# Patient Record
Sex: Male | Born: 1965 | Race: White | Hispanic: No | Marital: Single | Smoking: Never smoker
Health system: Southern US, Community
[De-identification: ages and names within clinical notes are randomized; demographics above are authoritative.]

## PROBLEM LIST (undated history)

## (undated) DIAGNOSIS — N200 Calculus of kidney: Secondary | ICD-10-CM

## (undated) DIAGNOSIS — I10 Essential (primary) hypertension: Secondary | ICD-10-CM

## (undated) DIAGNOSIS — E119 Type 2 diabetes mellitus without complications: Secondary | ICD-10-CM

---

## 2011-02-26 ENCOUNTER — Inpatient Hospital Stay (HOSPITAL_COMMUNITY)
Admission: RE | Admit: 2011-02-26 | Discharge: 2011-03-03 | DRG: 350 | Disposition: A | Discharge status: Home / Self Care | Payer: BC Managed Care – PPO | Source: Ambulatory Visit | Attending: Urology | Admitting: Urology

## 2011-02-26 DIAGNOSIS — N498 Inflammatory disorders of other specified male genital organs: Principal | ICD-10-CM | POA: Diagnosis present

## 2011-02-26 DIAGNOSIS — A4902 Methicillin resistant Staphylococcus aureus infection, unspecified site: Secondary | ICD-10-CM | POA: Diagnosis present

## 2011-02-26 DIAGNOSIS — E669 Obesity, unspecified: Secondary | ICD-10-CM | POA: Diagnosis present

## 2011-02-26 LAB — BASIC METABOLIC PANEL
BUN: 13 mg/dL (ref 6–23)
Chloride: 102 mEq/L (ref 96–112)
GFR calc Af Amer: >90 mL/min (ref >90)
GFR calc non Af Amer: 79 mL/min — ABNORMAL LOW (ref >90)
Glucose, Bld: 134 mg/dL — ABNORMAL HIGH (ref 70–99)
Potassium: 4.4 mEq/L (ref 3.5–5.1)
Sodium: 137 mEq/L (ref 135–145)

## 2011-02-26 LAB — SURGICAL PCR SCREEN: MRSA, PCR: NEGATIVE

## 2011-02-27 DIAGNOSIS — N498 Inflammatory disorders of other specified male genital organs: Secondary | ICD-10-CM

## 2011-02-27 LAB — BASIC METABOLIC PANEL
BUN: 14 mg/dL (ref 6–23)
CO2: 26 mEq/L (ref 19–32)
Chloride: 103 mEq/L (ref 96–112)
Creatinine, Ser: 1.09 mg/dL (ref 0.50–1.35)
GFR calc Af Amer: >90 mL/min (ref >90)
Glucose, Bld: 197 mg/dL — ABNORMAL HIGH (ref 70–99)
Potassium: 4.5 mEq/L (ref 3.5–5.1)

## 2011-02-27 LAB — DIFFERENTIAL
Basophils Absolute: 0 10*3/uL (ref 0.0–0.1)
Eosinophils Relative: 0 % (ref 0–5)
Lymphocytes Relative: 7 % — ABNORMAL LOW (ref 12–46)
Lymphs Abs: 0.8 10*3/uL (ref 0.7–4.0)
Neutro Abs: 10.2 10*3/uL — ABNORMAL HIGH (ref 1.7–7.7)

## 2011-02-27 LAB — CBC
HCT: 38.5 % — ABNORMAL LOW (ref 39.0–52.0)
Hemoglobin: 12.9 g/dL — ABNORMAL LOW (ref 13.0–17.0)
MCV: 90.4 fL (ref 78.0–100.0)
RBC: 4.26 MIL/uL (ref 4.22–5.81)
WBC: 11.4 10*3/uL — ABNORMAL HIGH (ref 4.0–10.5)

## 2011-02-28 LAB — DIFFERENTIAL
Basophils Absolute: 0 10*3/uL (ref 0.0–0.1)
Basophils Relative: 0 % (ref 0–1)
Eosinophils Absolute: 0.1 10*3/uL (ref 0.0–0.7)
Eosinophils Relative: 1 % (ref 0–5)
Lymphocytes Relative: 24 % (ref 12–46)
Lymphs Abs: 2.3 10*3/uL (ref 0.7–4.0)
Monocytes Absolute: 0.6 10*3/uL (ref 0.1–1.0)
Monocytes Relative: 7 % (ref 3–12)
Neutro Abs: 6.5 10*3/uL (ref 1.7–7.7)
Neutrophils Relative %: 68 % (ref 43–77)

## 2011-02-28 LAB — CBC
HCT: 35 % — ABNORMAL LOW (ref 39.0–52.0)
Hemoglobin: 11.4 g/dL — ABNORMAL LOW (ref 13.0–17.0)
MCH: 29.9 pg (ref 26.0–34.0)
MCHC: 32.6 g/dL (ref 30.0–36.0)
MCV: 91.9 fL (ref 78.0–100.0)
Platelets: 290 10*3/uL (ref 150–400)
RBC: 3.81 MIL/uL — ABNORMAL LOW (ref 4.22–5.81)
RDW: 12.1 % (ref 11.5–15.5)
WBC: 9.6 10*3/uL (ref 4.0–10.5)

## 2011-02-28 NOTE — Consult Note (Signed)
[name] Barrera, [name] Barrera NO.:  [id number]  MEDICAL RECORD NO.:  [id number]  LOCATION:  1401                         FACILITY:  [facility]  PHYSICIAN:  Judyann Munson, MD     DATE OF BIRTH:  06/08/1965  DATE OF CONSULTATION:  02/27/2011 DATE OF DISCHARGE:                                CONSULTATION   REQUESTING PHYSICIAN:  Jerilee Field, MD  REASON FOR CONSULTATION:  MRSA scrotal abscess.  HISTORY OF PRESENT ILLNESS:  Mr. Meek is a pleasant 45 year old male with no previous medical problems, who was noted to have 4 days prior to admission, early Sunday morning,to have a small knot, a subcentimeter in diameter, at the base of his scrotum.  He initially thought it was an  infected pimple.   By that evening, he started noticing that it had increased in size, at least 1 cm in diameter and exquisitely tender to palpation.  He also started to have low-grade fevers. subsequently. overnight had rigors and high temperatures of 102 for which he took Tylenol.  He went to work the following day but  by the day's end, started noticing having significant discomfort while sitting as well as swelling of his scrotum.  He went to his primary care provider the next day which was 2 days prior to this hospitalization where it was noted that he had cellulitis and possible fluctuance at the base of his scrotum.  His primary care provider gave him a  dose of ceftriaxone as well as pain medication and arranged for the patient to be seen by Urology that day.  At the urologist's office, he underwent simple I and D at the office for which an aerobic culture was sent of the purulent drainage.  That specimen had identified MRSA.  It is important to note that the patient already had received a dose of ceftriaxone.  It was noted that he was quite erythematous in the region and the urologist had arranged for the patient to come back within 48 hours for further evaluation.  He was given a  prescription for cephalexin as well as doxycycline.  During this time period, the patient states that he did not have that much significant improvement and still was increasingly tender at the base of his scrotum.  He still was having high fevers with rigors and felt that the swelling was extending by his pubic hairline.  When he was evaluated 48hrs after initial I X D, his urologist was able to express purulent material from  initial lancing site. Due to the extension of cellulitis, he admitted  was admitted to [facility] on October 11th for I and D under general anesthesia.  The patient underwent incision and drainage of his scrotal abscess and a bilateral spermatic cord block on October 11.  Very little blood loss, however, there was a large cavity extending 12 cm into the scrotum due to the abscess.  Specimen was sent for culture.  In the meantime, the patient has been placed on vancomycin, piperacillin and tazobactam, and gentamicin.  Since then, the patient has been afebrile. On admit,  he did have leukocytosis of 11.4 with a left shift of 89%.  The patient states that he is no longer febrile, however, he is in quite a bit of discomfort when he has to have his dressing changes.  The patient is not known to have any soft skin tissue infection.  No boils.  The only mentioned is that his elder son does have MRSA skin infections.  The patient last went to work on Monday, 3 days prior to admission, due to the discomfort he was having.  PAST MEDICAL HISTORY: 1. Acid reflux. 2. Occasional palpitations. 3. History of a vasectomy and a vasectomy reversal.  ALLERGIES:  No known drug allergies.  MEDICATIONS: 1. Vancomycin 1 mg q.12. 2. Piperacillin and tazobactam 3.75 mg IV q.8. 3. Gentamicin 560 mg IV q.24. 4. Enoxaparin 40 mg subcu daily. 5. Morphine 2 mg IV p.r.n. 6. Tylenol 650 mg p.o. q.4. 7. Phenergan 25 mg p.o. q.4 p.r.n. 8. Ambien 5 mg q.h.s. p.r.n.  REVIEW OF SYSTEMS:   Only thing what is mentioned in the HPI was fevers, chills, night sweats, scrotal swelling, and erythema.  He also noted to have some malaise, decreased appetite, but no other symptoms.  No headache, neck pain, difficulty vision.  No dysphagia.  No nausea or vomiting.  No diarrhea or constipation.  No arthralgias and no other rash other than on his scrotum.  A 12-point review of systems has been reviewed and otherwise negative.  SOCIAL HISTORY:  The patient works full-time in a Environmental education officer.  He also is divorced.  His wife is a Engineer, civil (consulting), but they are still in close contact.  They have 3 sons.  He enjoys fishing and hunting with his sons.  No smoking.  Occasional alcohol use.  No illicit drug use.  FAMILY HISTORY:  Concerning for nephrolithiasis and mother had breast cancer.  PHYSICAL EXAMINATION:  VITAL SIGNS:  He is afebrile at 98.1, T-max of 99.0; pulse is 71; blood pressure 115/68; respiration rate 16 to 20; 98% on room air. GENERAL:  This is a pleasant 45 year old Caucasian male in no acute distress. HEENT:  Normocephalic, atraumatic.  PERRLA, EOMI.  No scleral icterus. Oropharynx is clear.  No signs of thrush. NECK:  Supple.  No lymphadenopathy.  No JVD. PULMONARY EXAM:  Clear to auscultation bilaterally.  No wheezes, crackles, or rhonchi. CARDIAC EXAM:  Normal S1 and S2.  No gallops, murmurs, or rubs. ABDOMEN:  Protuberant abdomen.  Nontender, nondistended.  Positive bowel sounds.  No hepatosplenomegaly, trace inguinal lymphadenopathy. GU:  The patient has some erythema and swelling to his pubic symphysis. It has been outlined for the line of demarcation and still has packing from his scrotal incision and drainage.  No erythema extending to his thighs, but definitely to his inguinal line. EXTREMITIES;  No clubbing, cyanosis, or edema. SKIN EXAM:  No signs of rash other than in the scrotal area with erythema.  No other ulcers. NEUROLOGICAL EXAM:  Alert and oriented  x3.  Cranial nerves II-XII are grossly intact.  Motor is 5/5 in upper and lower.  Strength and sensation intact.  LABORATORY DATA:  His white count is 11.4, 89% neutrophils, hemoglobin 12.9, hematocrit 35.5, platelets are 279.  Creatinine of 1.09.  Micro data from cultures from the outside lab on October 9 shows few wbc's, GPCs in pairs and clusters identified as MRSA, sensitive to Bactrim of less than 10, gentamicin less than 0.5, vancomycin 1, clindamycin of less than 0.2, resistant to erythromycin, resistant to Cipro.  Rifampin is less than 0.5, tetracycline less than 1, linezolid of  2.  Microbiology:  Cultures from debridement on October 11, showed rare wbc, predominant PMNs.  No organism isolated thus far.  ASSESSMENT AND PLAN:  This is a 45 year old male, previously in good health, with scrotal abscess with methicillin-resistant Staphylococcus aureus; status post incision and drainage on October 11, postoperative day 1; remains afebrile on broad-spectrum antibiotics; vancomycin, piperacillin and tazobactam, and gentamicin.  Given the area of his abscess, potentially it could be polymicrobial.    I would recommend continuing on vancomycin and piperacillin/tazobactam until his cultures are finalized. due to the spread of interaction and the quick onset, it still would favor just being an methicillin-resistant Staphylococcus aureus.    Would recommend that his vancomycin trough (goal 10 to 15) We would discontinue the gentamicin as he appears stable and is not needed at this time.    We will follow up ulture and decide what all regimen could be given for the patient, be it Bactrim or doxycycline and would treat for a period of 14 days.  We will decide if the patient needs gram-negative coverage based on culture data which takes roughly 48 hours to finalize.  It has been a pleasure to see Mr. Rademaker.  Greater than half of this 45- minute assessment was spent counseling the  patient and review records.          ______________________________ Judyann Munson, MD     CS/MEDQ  D:  02/27/2011  T:  02/27/2011  Job:  [id number]  Electronically Signed by Judyann Munson MD on 02/28/2011 10:36:53 AM

## 2011-03-01 LAB — CULTURE, ROUTINE-ABSCESS

## 2011-03-02 LAB — VANCOMYCIN, TROUGH: Vancomycin Tr: 8.5 ug/mL — ABNORMAL LOW (ref 10.0–20.0)

## 2011-03-02 LAB — CREATININE, SERUM
Creatinine, Ser: 0.97 mg/dL (ref 0.50–1.35)
GFR calc non Af Amer: >90 mL/min (ref >90)

## 2011-03-03 LAB — ANAEROBIC CULTURE

## 2011-03-04 NOTE — Discharge Summary (Signed)
  [name], [name] Barrera               ACCOUNT NO.:  [id number]  MEDICAL RECORD NO.:  [id number]  LOCATION:  1401                         FACILITY:  [facility]  PHYSICIAN:  Jerilee Field, MD   DATE OF BIRTH:  [birth year]  DATE OF ADMISSION:  02/26/2011 DATE OF DISCHARGE:  03/03/2011                              DISCHARGE SUMMARY   ADMITTING DIAGNOSES:  Scrotal abscess and cellulitis.  DISCHARGE DIAGNOSES:  Scrotal abscess and cellulitis - methicillin- resistant Staphylococcus aureus.  PROCEDURE DURING HOSPITALIZATION:  Scrotal incision and drainage.  HOSPITAL COURSE:  Mr. Cremer is a 45 year old male.  He is admitted to the hospital following incision of a left posterior scrotal abscess. The patient initially failed outpatient I and D with doxycycline and Keflex coverage.  Cultures grew out abundant methicillin-resistant Staph aureus sensitive to vancomycin.  The patient was initially admitted for IV antibiotics and wound care.  ID was consulted.  His gentamicin was discontinued initially followed by the Zosyn, and the patient remained on vancomycin therapy for MRSA.  After the drainage, he developed erythema of the suprapubic region, which took several days to resolve, but has faded significantly today on day of discharge.  The patient has remained afebrile with stable vitals.  He was also kept for IV Ativan and allotted for dressing changes.  The patient has been getting a wet- to-dry dressing change each day.  Today on the day of discharge, the patient has much improved pain there.  The wound is clean with no drainage.  There is just a slight remaining bed of induration at the superior portion of the wound without erythema or fluctuance.  There is also no edema or crepitus.  The erythema has greatly diminished. Infectious disease saw the patient and recommended Bactrim DS 2 tablets p.o. b.i.d. for another 7 days.  The patient and his wife have been performing the dressing changes.   She is a Engineer, civil (consulting) with good knowledge of the wet-to-dry technique.  DISCHARGE MEDICATIONS: 1. Bactrim DS 2 tablets p.o. b.i.d. for 7 days. 2. Percocet 5/325 one to two p.o. q.6 hours p.r.n. pain. 3. Ativan 1 mg 1 to 2 p.o. p.r.n. dressing changes.  These are new     prescriptions and the following are continuation of his home     medicines: 4. Tylenol Extra Strength 500 mg. 5. Multivitamin.  The following prescriptions were stopped on admission: 1. Keflex 500 mg. 2. Doxycycline 100 mg.  DISCHARGE INSTRUCTIONS:  The patient may shower.  He was instructed to continue daily wet-to-dry dressing changes of the left scrotal wound. He was instructed to call if he has any fever, chills; if he feels poorly; notices any erythema, swelling, pain, or drainage of his wound; or any other concerns.  FOLLOWUP APPOINTMENT:  The patient to follow up with Dr. Mena Goes at [facility] in 48 hours on March 05, 2011.  The patient will call [phone] to confirm appointment.          ______________________________ Jerilee Field, MD     ME/MEDQ  D:  03/03/2011  T:  03/03/2011  Job:  [id number]  Electronically Signed by Jerilee Field MD on 03/04/2011 01:07:47 PM

## 2011-03-06 NOTE — Op Note (Signed)
[name], Barrera NO.:  [id number]  MEDICAL RECORD NO.:  Barrera  LOCATION:  1401                         FACILITY:  [facility]  PHYSICIAN:  Natalia Leatherwood, MD    DATE OF BIRTH:  02/13/1966  DATE OF PROCEDURE:  02/26/2011 DATE OF DISCHARGE:                              OPERATIVE REPORT   SURGEON:  Natalia Leatherwood, MD.  ASSISTANT:  None.  PREOPERATIVE DIAGNOSIS:  Scrotal abscess.  POSTOPERATIVE DIAGNOSIS:  Scrotal abscess.  PROCEDURE PERFORMED:  Incision and drainage of scrotal abscess and bilateral spermatic cord block.  ESTIMATED BLOOD LOSS:  5 mL of blood.  SPECIMEN:  Swabs of the abscess sent for aerobic and anaerobic cultures to microbiology.  FINDINGS:  A large cavity and scrotum extending 12 cm into the scrotum due to the abscess.  HISTORY OF PRESENT ILLNESS:  A 45 year old gentleman, who underwent an incision and drainage of his scrotum on October 9, in the office with Dr. Jerilee Field.  The patient returned for followup today and was found to have more extensive abscess.  It was felt that this needed to go to the operating room.  I performed a history and physical examination on this patient.  We discussed the risks and benefits of the procedure as well as alternatives, which would include IV antibiotics and observation.  We discussed the likelihood that this procedure would achieve his goals.  The patient wished to proceed with incision and drainage.  Informed consent was obtained.  PROCEDURE:  After informed consent was obtained, the patient was taken to operating room, where he was placed in supine position.  IV antibiotics of vancomycin were infused and the general anesthesia was induced.  He was placed in a dorsal lithotomy position to make sure to pad all pertinent neurovascular pressure points.  After this, he was prepped and draped in a sterile fashion after his hair was removed from his scrotum.  A time-out was performed in  which the correct patient, surgical site, and procedure were agreed upon by the team.  After this, an incision with #10 blade was made over previous incision site with return of a large amount of purulent material.  This was swabbed and the swabs were sent for aerobic and anaerobic cultures.  Following this, it was felt the incision needed to be extended somewhat due to the depth of the abscess cavity.  This was extended slightly with the scalpel.  After this was done, Kelly clamps and digital examination were used to break up the loculations.  In the end, the abscess cavity did extend approximately 12 cm up into the scrotum towards the left inguinal region.  It was well contained in the cavity.  There was no involvement of the testicle.  After this was done, and all loculations were broken up.  One 1 liter normal saline was used to irrigate out the entire abscess cavity, and then 0.5 liter of double antibiotic irrigation was used to irrigate.  After this was done, the abscess cavity was packed with moist to dry Kerlix that was dampened with double antibiotic irrigation.  Hemostasis maintained with Bovie electrocautery.  After this was done, a bilateral spermatic cord block was  done with approximately 10 cc of 0.25% plain Marcaine bilaterally and then the edges of the incision were injected with Marcaine.  Total of 30 cc of Marcaine were used.  After this was complete, ABD pad and Kerlix were placed over the wound site and packing and then mesh underwear was placed on the patient.  He was placed back in a supine position. Anesthesia was reversed and was taken back to the PACU in stable condition.  The area of erythema was marked with a marker for future observation.  The patient will be admitted to the hospital for IV antibiotics and for observation.          ______________________________ Natalia Leatherwood, MD     DW/MEDQ  D:  02/26/2011  T:  02/27/2011  Job:   [id number]  Electronically Signed by Natalia Leatherwood MD on 03/06/2011 07:53:25 AM

## 2012-12-12 ENCOUNTER — Encounter (HOSPITAL_COMMUNITY): Payer: Self-pay | Admitting: *Deleted

## 2012-12-12 ENCOUNTER — Emergency Department (HOSPITAL_COMMUNITY): Payer: BC Managed Care – PPO

## 2012-12-12 ENCOUNTER — Emergency Department (HOSPITAL_COMMUNITY)
Admission: EM | Admit: 2012-12-12 | Discharge: 2012-12-13 | Disposition: A | Discharge status: Home / Self Care | Payer: BC Managed Care – PPO | Attending: Emergency Medicine | Admitting: Emergency Medicine

## 2012-12-12 DIAGNOSIS — N200 Calculus of kidney: Secondary | ICD-10-CM | POA: Insufficient documentation

## 2012-12-12 LAB — URINALYSIS, ROUTINE W REFLEX MICROSCOPIC
Ketones, ur: 15 mg/dL — AB
Protein, ur: NEGATIVE mg/dL
Specific Gravity, Urine: 1.036 — ABNORMAL HIGH (ref 1.005–1.030)
Urobilinogen, UA: 1 mg/dL (ref 0.0–1.0)

## 2012-12-12 LAB — CBC WITH DIFFERENTIAL/PLATELET
Basophils Absolute: 0 10*3/uL (ref 0.0–0.1)
HCT: 42.4 % (ref 39.0–52.0)
Lymphocytes Relative: 14 % (ref 12–46)
Neutro Abs: 7.8 10*3/uL — ABNORMAL HIGH (ref 1.7–7.7)
Platelets: 294 10*3/uL (ref 150–400)
RBC: 4.97 MIL/uL (ref 4.22–5.81)
RDW: 12.2 % (ref 11.5–15.5)
WBC: 9.6 10*3/uL (ref 4.0–10.5)

## 2012-12-12 LAB — COMPREHENSIVE METABOLIC PANEL
ALT: 54 U/L — ABNORMAL HIGH (ref 0–53)
AST: 33 U/L (ref 0–37)
Alkaline Phosphatase: 54 U/L (ref 39–117)
CO2: 25 mEq/L (ref 19–32)
Chloride: 105 mEq/L (ref 96–112)
GFR calc non Af Amer: 60 mL/min — ABNORMAL LOW (ref >90)
Sodium: 141 mEq/L (ref 135–145)
Total Bilirubin: 0.3 mg/dL (ref 0.3–1.2)

## 2012-12-12 LAB — URINE MICROSCOPIC-ADD ON

## 2012-12-12 MED ORDER — SODIUM CHLORIDE 0.9 % IV BOLUS (SEPSIS)
1000.0000 mL | Freq: Once | INTRAVENOUS | Status: AC
  Administered 2012-12-12: 1000 mL via INTRAVENOUS

## 2012-12-12 MED ORDER — MORPHINE SULFATE 4 MG/ML IJ SOLN
4.0000 mg | Freq: Once | INTRAMUSCULAR | Status: AC
  Administered 2012-12-12: 4 mg via INTRAVENOUS
  Filled 2012-12-12: qty 1

## 2012-12-12 MED ORDER — HYDROMORPHONE HCL PF 1 MG/ML IJ SOLN: 1.0000 mg | Freq: Once | INTRAMUSCULAR | Status: DC

## 2012-12-12 MED ORDER — ONDANSETRON HCL 4 MG/2ML IJ SOLN
4.0000 mg | Freq: Once | INTRAMUSCULAR | Status: AC
  Administered 2012-12-12: 4 mg via INTRAVENOUS
  Filled 2012-12-12: qty 2

## 2012-12-12 NOTE — ED Notes (Signed)
Pt in c/o right ground area into right hip area with vomiting, states he went to [facility] walk in [facility] and was sent here for further evaluation for r/o incarcerated hernia

## 2012-12-12 NOTE — ED Provider Notes (Signed)
CSN: [id number]     Arrival date & time 12/12/12  2101 History     First MD Initiated Contact with Patient 12/12/12 2237     Chief Complaint  Patient presents with  . Groin Pain    (Consider location/radiation/quality/duration/timing/severity/associated sxs/prior Treatment) HPI Comments: Pt w/ no PMHx now w/ right groin and flank pain. States acute onset at 5pm, severe right groin pain radiating to right flank. A/w multiple episodes n/v - NBNB. Denies hematuria, polyuria or dysuria. + hx of nephrolithiasis. No fever. No abd pain. No hx of hernia or abd surgery. Pain is constant and severe, aching. Exacerbated w/ movement and not relieved by anything. Seen at urgent care and concern for hernia and transferred to ED. On arrival pain is improving.   Patient is a 47 y.o. male presenting with general illness. The history is provided by the patient. No language interpreter was used.  Illness Location:  GI/GU Quality:  Right flank pain, emesis, groin pain Severity:  Severe Onset quality:  Sudden Timing:  Constant Progression:  Unchanged Chronicity:  New Associated symptoms: nausea and vomiting   Associated symptoms: no abdominal pain, no chest pain, no congestion, no cough, no diarrhea, no fever, no headaches, no rash, no shortness of breath and no sore throat     History reviewed. No pertinent past medical history. History reviewed. No pertinent past surgical history. History reviewed. No pertinent family history. History  Substance Use Topics  . Smoking status: Not on file  . Smokeless tobacco: Not on file  . Alcohol Use: Not on file    Review of Systems  Constitutional: Negative for fever and chills.  HENT: Negative for congestion and sore throat.   Respiratory: Negative for cough and shortness of breath.   Cardiovascular: Negative for chest pain and leg swelling.  Gastrointestinal: Positive for nausea and vomiting. Negative for abdominal pain, diarrhea and constipation.   Genitourinary: Positive for flank pain. Negative for dysuria and frequency.       Groin pain  Skin: Negative for color change and rash.  Neurological: Negative for dizziness and headaches.  Psychiatric/Behavioral: Negative for confusion and agitation.  All other systems reviewed and are negative.    Allergies  Review of patient's allergies indicates no known allergies.  Home Medications  No current outpatient prescriptions on file. BP 139/90  Pulse 82  Temp(Src) 97.9 F (36.6 C) (Oral)  Resp 15  Ht 6\' 1"  (1.854 m)  Wt 245 lb (111.131 kg)  BMI 32.33 kg/m2  SpO2 97% Physical Exam  Constitutional: He is oriented to person, place, and time. He appears well-developed and well-nourished. No distress.  HENT:  Head: Normocephalic and atraumatic.  Eyes: EOM are normal. Pupils are equal, round, and reactive to light.  Neck: Normal range of motion. Neck supple.  Cardiovascular: Normal rate and regular rhythm.   Pulmonary/Chest: Effort normal. No respiratory distress.  Abdominal: Soft. He exhibits no distension. There is tenderness (right flank). There is no rigidity, no guarding, no CVA tenderness, no tenderness at McBurney's point and negative Murphy's sign. Hernia confirmed negative in the right inguinal area.  Genitourinary: Testes normal. Right testis shows no mass, no swelling and no tenderness. Right testis is descended. Cremasteric reflex is not absent on the right side.  Musculoskeletal: Normal range of motion. He exhibits no edema.  Neurological: He is alert and oriented to person, place, and time.  Skin: Skin is warm and dry.  Psychiatric: He has a normal mood and affect. His behavior is normal.  ED Course   Procedures (including critical care time)  Results for orders placed during the hospital encounter of 12/12/12  CBC WITH DIFFERENTIAL      Result Value Range   WBC 9.6  4.0 - 10.5 K/uL   RBC 4.97  4.22 - 5.81 MIL/uL   Hemoglobin 15.6  13.0 - 17.0 g/dL   HCT  45.4  09.8 - 11.9 %   MCV 85.3  78.0 - 100.0 fL   MCH 31.4  26.0 - 34.0 pg   MCHC 36.8 (*) 30.0 - 36.0 g/dL   RDW 14.7  82.9 - 56.2 %   Platelets 294  150 - 400 K/uL   Neutrophils Relative % 81 (*) 43 - 77 %   Neutro Abs 7.8 (*) 1.7 - 7.7 K/uL   Lymphocytes Relative 14  12 - 46 %   Lymphs Abs 1.4  0.7 - 4.0 K/uL   Monocytes Relative 5  3 - 12 %   Monocytes Absolute 0.5  0.1 - 1.0 K/uL   Eosinophils Relative 1  0 - 5 %   Eosinophils Absolute 0.1  0.0 - 0.7 K/uL   Basophils Relative 0  0 - 1 %   Basophils Absolute 0.0  0.0 - 0.1 K/uL  COMPREHENSIVE METABOLIC PANEL      Result Value Range   Sodium 141  135 - 145 mEq/L   Potassium 4.3  3.5 - 5.1 mEq/L   Chloride 105  96 - 112 mEq/L   CO2 25  19 - 32 mEq/L   Glucose, Bld 128 (*) 70 - 99 mg/dL   BUN 12  6 - 23 mg/dL   Creatinine, Ser 1.30 (*) 0.50 - 1.35 mg/dL   Calcium 9.1  8.4 - 86.5 mg/dL   Total Protein 7.4  6.0 - 8.3 g/dL   Albumin 4.4  3.5 - 5.2 g/dL   AST 33  0 - 37 U/L   ALT 54 (*) 0 - 53 U/L   Alkaline Phosphatase 54  39 - 117 U/L   Total Bilirubin 0.3  0.3 - 1.2 mg/dL   GFR calc non Af Amer 60 (*) >90 mL/min   GFR calc Af Amer 69 (*) >90 mL/min  URINALYSIS, ROUTINE W REFLEX MICROSCOPIC      Result Value Range   Color, Urine YELLOW  YELLOW   APPearance CLOUDY (*) CLEAR   Specific Gravity, Urine 1.036 (*) 1.005 - 1.030   pH 5.5  5.0 - 8.0   Glucose, UA NEGATIVE  NEGATIVE mg/dL   Hgb urine dipstick LARGE (*) NEGATIVE   Bilirubin Urine SMALL (*) NEGATIVE   Ketones, ur 15 (*) NEGATIVE mg/dL   Protein, ur NEGATIVE  NEGATIVE mg/dL   Urobilinogen, UA 1.0  0.0 - 1.0 mg/dL   Nitrite NEGATIVE  NEGATIVE   Leukocytes, UA NEGATIVE  NEGATIVE  URINE MICROSCOPIC-ADD ON      Result Value Range   Squamous Epithelial / LPF RARE  RARE   RBC / HPF 11-20  <3 RBC/hpf   Bacteria, UA MANY (*) RARE   Crystals CA OXALATE CRYSTALS (*) NEGATIVE   CT Abdomen Pelvis Wo Contrast (Final result)  Result time: 12/13/12 00:14:23    Final  result by Rad Results In Interface (12/13/12 00:14:23)    Narrative:   *RADIOLOGY REPORT*  Clinical Data: Right-sided groin pain radiating into the right lower back. Nausea and vomiting.  CT ABDOMEN AND PELVIS WITHOUT CONTRAST  Technique: Multidetector CT imaging of the abdomen and pelvis was performed  following the standard protocol without intravenous contrast.  Comparison: 07/08/2006  Findings: A tiny calculus measuring 1 to 2 mm is located either at the right ureterovesical junction or in the bladder lumen itself. There is associated minimal fullness of the right collecting system. No other calculi are identified on the right. There is a single nonobstructing calculus in the lower pole of the left kidney measuring 2 mm.  The liver shows marked steatosis without overt cirrhotic changes or focal masses. No biliary ductal dilatation is seen. The gallbladder, pancreas, spleen and adrenal glands are unremarkable. No ascites or focal fluid collection is identified.  Bowel loops are unremarkable and within normal limits. Small bilateral inguinal hernias are present containing fat. No masses or enlarged lymph nodes are seen.  Visualized lower chest shows mild prominence of the distal esophagus which may be thickened. Correlation suggested with any gastroesophageal symptoms. 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  IMPRESSION:  1. Mild right hydronephrosis with a 1-2 mm calculus located at the right ureterovesical junction versus bladder lumen. 2. Marked hepatic steatosis. 3. Small bilateral inguinal hernias containing fat. 4. Somewhat prominent esophagus in the lower chest which may be thickened. Correlation suggested with any gastroesophageal symptoms.   Original Report Authenticated By: Irish Lack, M.D.         No results found. No diagnosis found.  MDM  Exam as above, vitals  unremarkable. No hernia on exam - doubt incarcerated or strangulate hernia. CT abd reveals 1-62mm stone at right UVJ v/s bladder lumen w/ mild right hydronephrosis. CT also reveals small bilateral inguinal hernias - not clinically significant. U/a equivocal for UTI - given dose of rocephin. No leukocytosis. Doubt pyelo. No obstructing stone noted. Mild elevation in Cr - 1.38 - likely transient elevation from recent stone. Will likely normalize - recommend fup w/ pcp for repeat Cr.  Labs otherwise unremarkable. Given 1L IVF and dilaudid w/ resolution of pain. Reassessed, abd soft and benign. Given toradol and flomax. Stable for d/c home. Tolerating PO. At this time stone likely passed, no need for inpt care. Given strict return precautions and urology follow up. D/c in good condition. Pain free and tolerating PO.   I have personally reviewed labs and imaging and considered in my MDM. Case d/w Dr Blinda Leatherwood  1. Nephrolithiasis    New Prescriptions   ONDANSETRON (ZOFRAN) 4 MG TABLET    Take 1 tablet (4 mg total) by mouth every 6 (six) hours.   OXYCODONE-ACETAMINOPHEN (PERCOCET) 5-325 MG PER TABLET    Take 1 tablet by mouth every 4 (four) hours as needed for pain.   TAMSULOSIN (FLOMAX) 0.4 MG CAPS    Take 1 capsule (0.4 mg total) by mouth daily.   [facility] [address] [place] [address] [phone] Schedule an appointment as soon as possible for a visit As needed if symptoms worsen    Audelia Hives, MD 12/13/12 [phone]

## 2012-12-12 NOTE — ED Notes (Addendum)
Pt reports sudden 9/10 right sided groin pain at 1700 tonight. States pain was initially in right lower groin, but is now radiating into right lower back. Pt reports N/V when pain started with 1 episode of nausea. Pt reporting mild nausea at this time.  Denies dysuria, hematuria. Hx of kidney stones and  left sided scrotum abscess.

## 2012-12-12 NOTE — ED Notes (Signed)
Concern for incarcerated hernia, c/o abd pain and nv. Sent by Dr. Tenny Craw from [facility].

## 2012-12-13 MED ORDER — OXYCODONE-ACETAMINOPHEN 5-325 MG PO TABS: 1.0000 | ORAL_TABLET | ORAL | Status: AC | PRN

## 2012-12-13 MED ORDER — ONDANSETRON HCL 4 MG PO TABS: 4.0000 mg | ORAL_TABLET | Freq: Four times a day (QID) | ORAL | Status: AC

## 2012-12-13 MED ORDER — DEXTROSE 5 % IV SOLN
1.0000 g | Freq: Once | INTRAVENOUS | Status: AC
  Administered 2012-12-13: 1 g via INTRAVENOUS
  Filled 2012-12-13: qty 10

## 2012-12-13 MED ORDER — TAMSULOSIN HCL 0.4 MG PO CAPS
0.4000 mg | ORAL_CAPSULE | Freq: Once | ORAL | Status: AC
  Administered 2012-12-13: 0.4 mg via ORAL
  Filled 2012-12-13: qty 1

## 2012-12-13 MED ORDER — KETOROLAC TROMETHAMINE 30 MG/ML IJ SOLN
30.0000 mg | Freq: Once | INTRAMUSCULAR | Status: AC
  Administered 2012-12-13: 30 mg via INTRAVENOUS
  Filled 2012-12-13: qty 1

## 2012-12-13 MED ORDER — TAMSULOSIN HCL 0.4 MG PO CAPS: 0.4000 mg | ORAL_CAPSULE | Freq: Every day | ORAL | Status: AC

## 2012-12-14 NOTE — ED Provider Notes (Signed)
I saw and evaluated the patient, reviewed the resident's note and I agree with the findings and plan.  Seen and evaluated for flank pain, w/u shows 1-2 mm UVJ stone that explains symptoms. Treat with analgesia.  Gilda Crease, MD 12/14/12 [phone]

## 2014-08-29 IMAGING — CT CT ABD-PELV W/O CM
2 of 5 series · 17 of 46 positions shown, 19 images · non-contrast
Comparison: 07/08/2006

CLINICAL DATA: Right-sided groin pain radiating into the right
lower back.  Nausea and vomiting.

CT ABDOMEN AND PELVIS WITHOUT CONTRAST
TECHNIQUE: Multidetector CT imaging of the abdomen and pelvis was
performed following the standard protocol without intravenous
contrast.

[Series 2: abd/ pelvis 5.0 i30f 1 · axial · 0.96mm/px · z∈[+264,+709]mm · 14 of 101 slices shown, 16 images]
[im 6/101  soft-tissue]
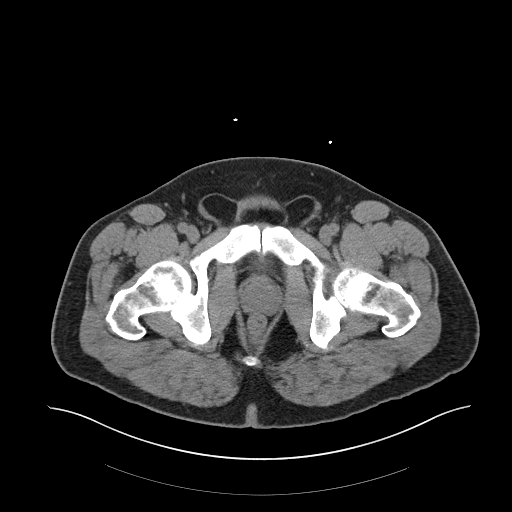
[im 6/101  bone]
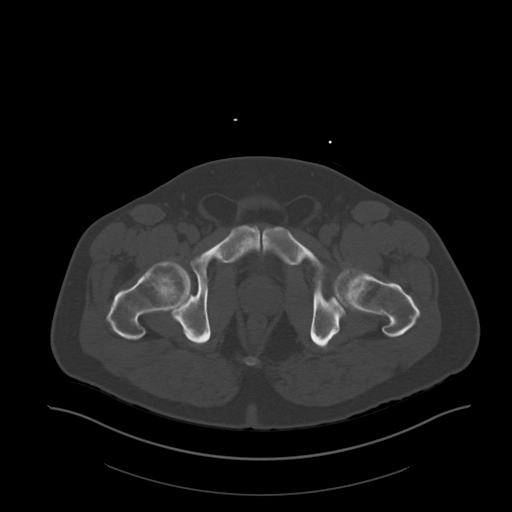
[im 11/101  soft-tissue]
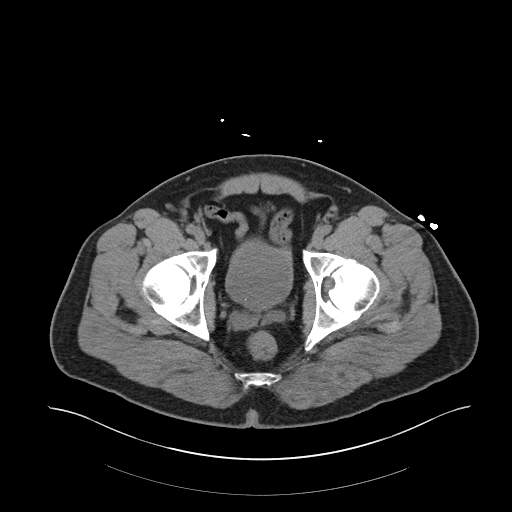
[im 22/101  soft-tissue]
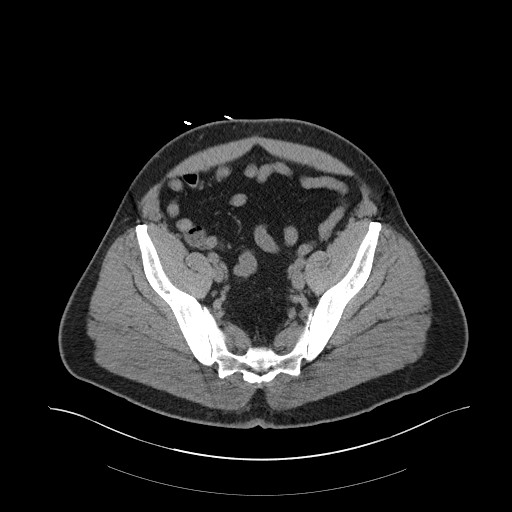
[im 27/101  soft-tissue]
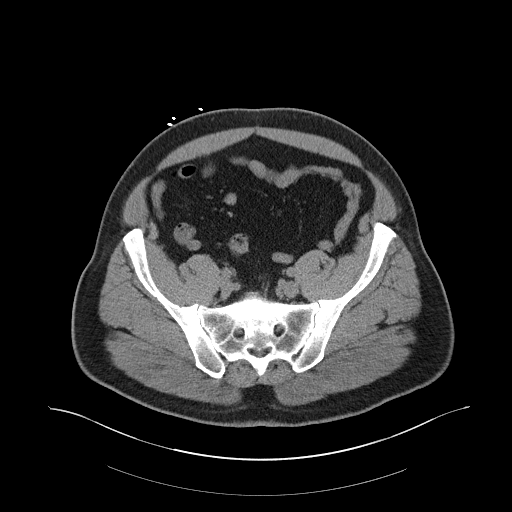
[im 32/101  soft-tissue]
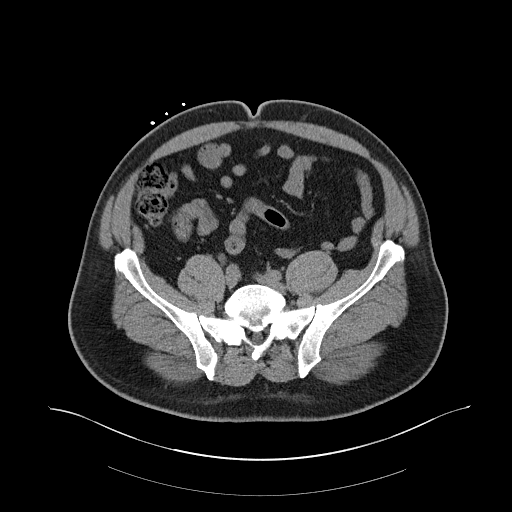
[im 43/101  soft-tissue]
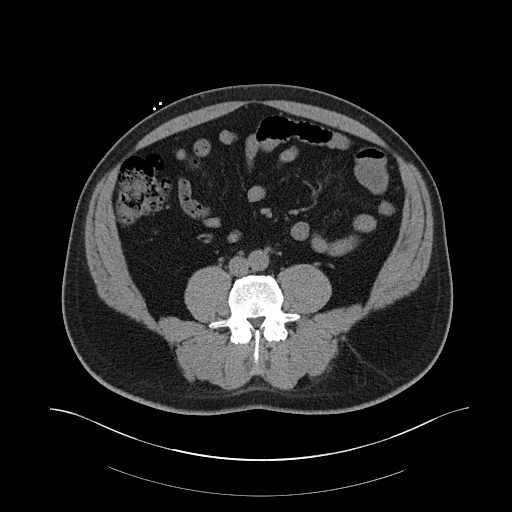
[im 48/101  soft-tissue]
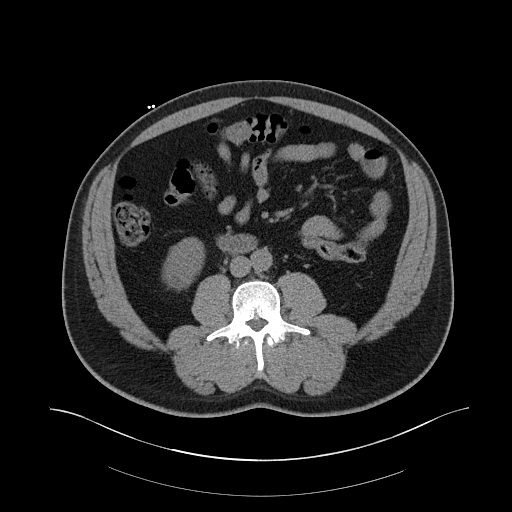
[im 53/101  soft-tissue]
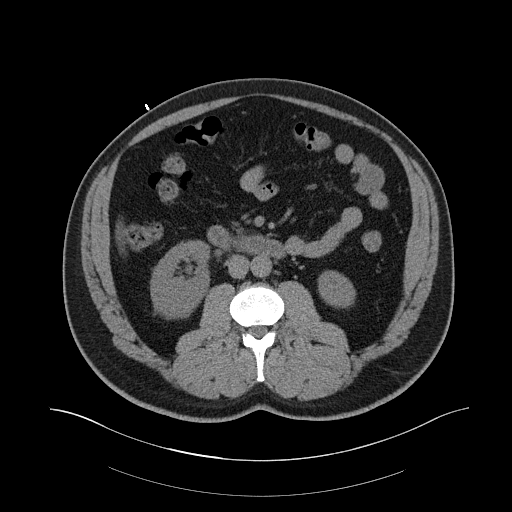
[im 58/101  soft-tissue]
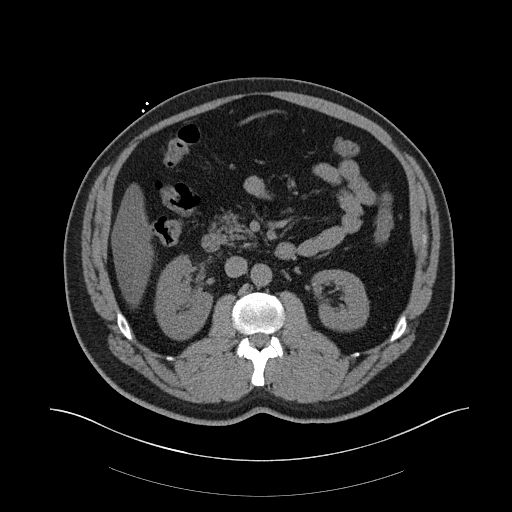
[im 58/101  bone]
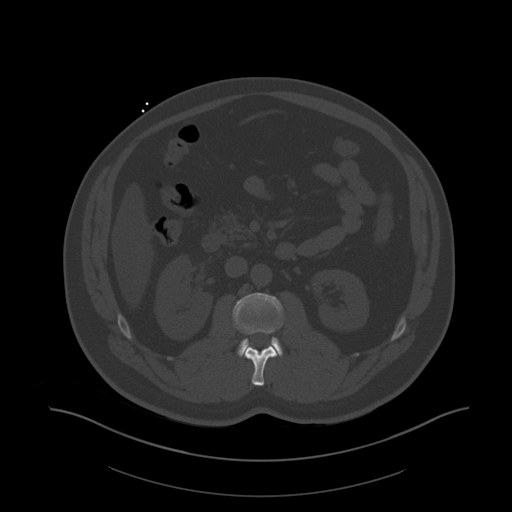
[im 69/101  soft-tissue]
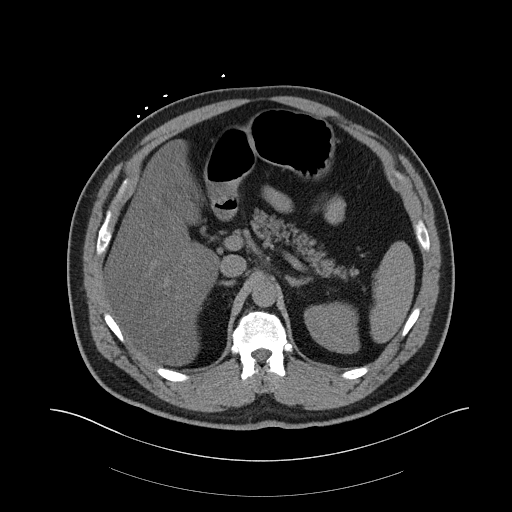
[im 74/101  soft-tissue]
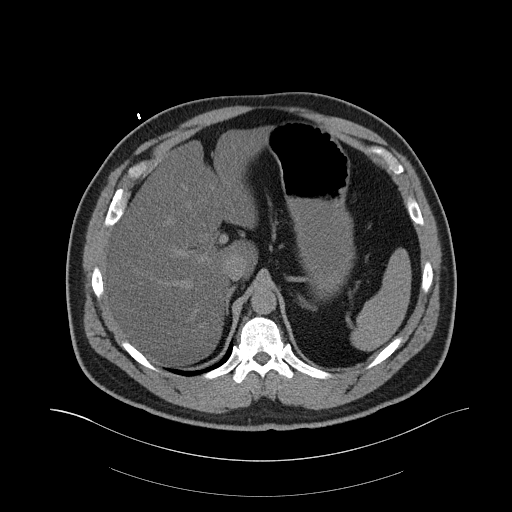
[im 79/101  soft-tissue]
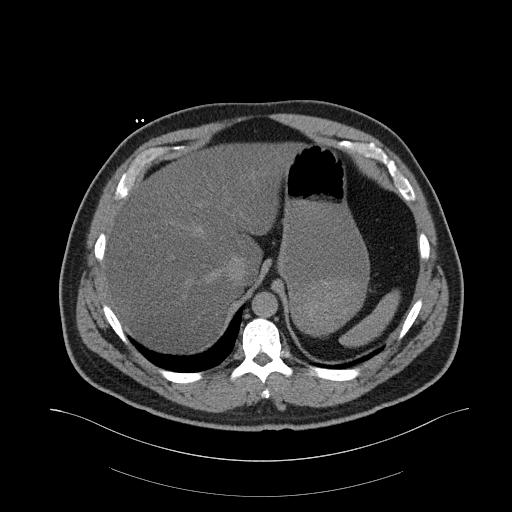
[im 90/101  soft-tissue]
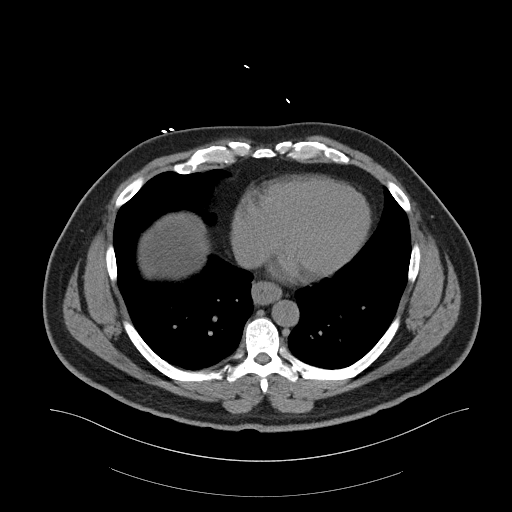
[im 95/101  soft-tissue]
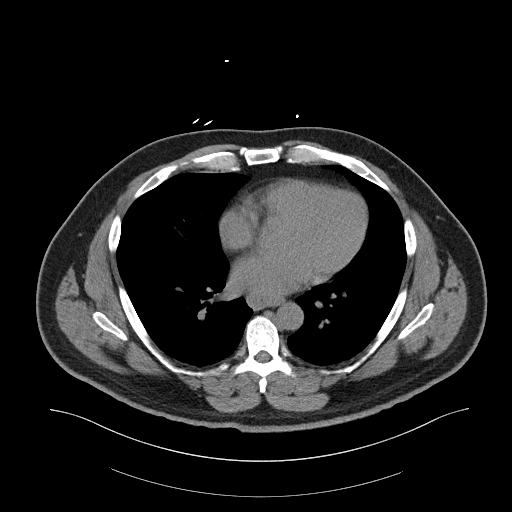

[Series 5: cor st · coronal · 0.95mm/px · 3 of 112 slices shown]
[im 38/112  soft-tissue]
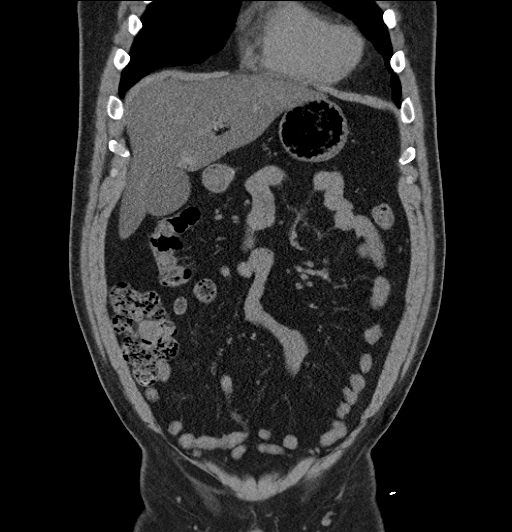
[im 50/112  soft-tissue]
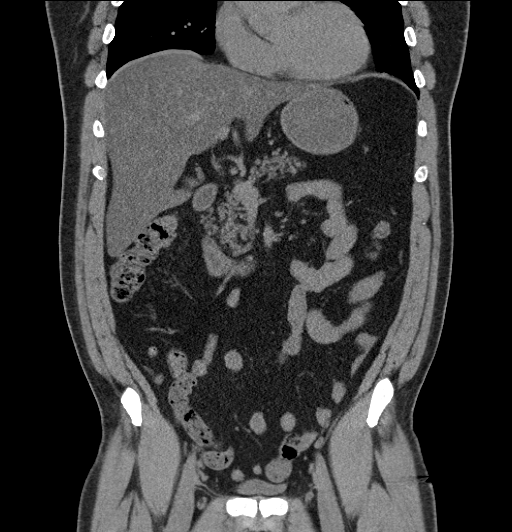
[im 62/112  soft-tissue]
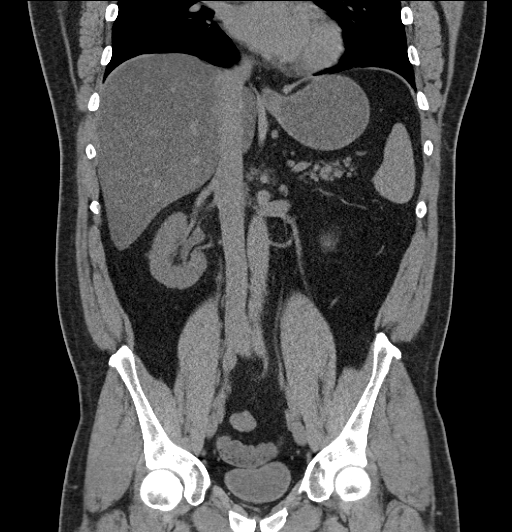

[17 of 46 positions shown; findings below may reference images not displayed]

FINDINGS: A tiny calculus measuring 1 to 2 mm is located either at
the right ureterovesical junction or in the bladder lumen itself.
There is associated minimal fullness of the right collecting
system.  No other calculi are identified on the right.  There is a
single nonobstructing calculus in the lower pole of the left kidney
measuring 2 mm.

The liver shows marked steatosis without overt cirrhotic changes or
focal masses.  No biliary ductal dilatation is seen.  The
gallbladder, pancreas, spleen and adrenal glands are unremarkable.
No ascites or focal fluid collection is identified.

Bowel loops are unremarkable and within normal limits.  Small
bilateral inguinal hernias are present containing fat.  No masses
or enlarged lymph nodes are seen.

Visualized lower chest shows mild prominence of the distal
esophagus which may be thickened.  Correlation suggested with any
gastroesophageal symptoms. Degenerative disc disease present at L5-
S1 with what appears to be an extruded and partially calcified disc
fragment.
IMPRESSION: 1.  Mild right hydronephrosis with a 1-2 mm calculus located at the
right ureterovesical junction versus bladder lumen.
2.  Marked hepatic steatosis.
3.  Small bilateral inguinal hernias containing fat.
4.  Somewhat prominent esophagus in the lower chest which may be
thickened.  Correlation suggested with any gastroesophageal
symptoms.

## 2021-11-09 ENCOUNTER — Ambulatory Visit: Payer: Self-pay

## 2021-11-09 ENCOUNTER — Ambulatory Visit
Admission: EM | Admit: 2021-11-09 | Discharge: 2021-11-09 | Disposition: A | Discharge status: Home / Self Care | Payer: Worker's Compensation | Attending: Physician Assistant | Admitting: Physician Assistant

## 2021-11-09 ENCOUNTER — Ambulatory Visit: Payer: Self-pay | Attending: Physician Assistant

## 2021-11-09 DIAGNOSIS — S67192A Crushing injury of right middle finger, initial encounter: Secondary | ICD-10-CM

## 2021-11-09 DIAGNOSIS — S62662B Nondisplaced fracture of distal phalanx of right middle finger, initial encounter for open fracture: Secondary | ICD-10-CM

## 2021-11-09 DIAGNOSIS — S67194A Crushing injury of right ring finger, initial encounter: Secondary | ICD-10-CM

## 2021-11-09 MED ORDER — ONDANSETRON 4 MG PO TBDP
4.0000 mg | ORAL_TABLET | Freq: Once | ORAL | Status: AC
  Administered 2021-11-09: 4 mg via ORAL

## 2021-11-09 MED ORDER — SULFAMETHOXAZOLE-TRIMETHOPRIM 800-160 MG PO TABS: 1.0000 | ORAL_TABLET | Freq: Two times a day (BID) | ORAL | 0 refills | Status: AC

## 2021-11-09 NOTE — ED Provider Notes (Signed)
[facility]    CSN: [id number] Arrival date & time: 11/09/21  1123      History   Chief Complaint Chief Complaint  Patient presents with   Hand Injury    HPI [name] Barrera is a 56 y.o. male.   Pt reports his hand got caught between two steel doors while at work on Friday, three days ago.  He reports laceration to the middle and ring finger with more pain to the tip of middle finger.  He has the area bandaged, bleeding controlled.  He has no other injuries.      History reviewed. No pertinent past medical history.  There are no problems to display for this patient.   History reviewed. No pertinent surgical history.     Home Medications    Prior to Admission medications   Medication Sig Start Date End Date Taking? Authorizing Provider  sulfamethoxazole-trimethoprim (BACTRIM DS) 800-160 MG tablet Take 1 tablet by mouth 2 (two) times daily for 7 days. 11/09/21 11/16/21 Yes Ward, Tylene Fantasia, PA-C  ondansetron (ZOFRAN) 4 MG tablet Take 1 tablet (4 mg total) by mouth every 6 (six) hours. 12/13/12   Audelia Hives, MD  oxyCODONE-acetaminophen (PERCOCET) 5-325 MG per tablet Take 1 tablet by mouth every 4 (four) hours as needed for pain. 12/13/12   Audelia Hives, MD  tamsulosin (FLOMAX) 0.4 MG CAPS Take 1 capsule (0.4 mg total) by mouth daily. 12/13/12   Audelia Hives, MD    Family History Family History  Family history unknown: Yes    Social History Social History   Tobacco Use   Smoking status: Never   Smokeless tobacco: Never     Allergies   Patient has no known allergies.   Review of Systems Review of Systems  Constitutional:  Negative for chills and fever.  HENT:  Negative for ear pain and sore throat.   Eyes:  Negative for pain and visual disturbance.  Respiratory:  Negative for cough and shortness of breath.   Cardiovascular:  Negative for chest pain and palpitations.  Gastrointestinal:  Negative for abdominal pain and vomiting.   Genitourinary:  Negative for dysuria and hematuria.  Musculoskeletal:  Positive for arthralgias (right middle finger pain). Negative for back pain.  Skin:  Positive for wound. Negative for color change and rash.  Neurological:  Negative for seizures and syncope.  All other systems reviewed and are negative.    Physical Exam Triage Vital Signs ED Triage Vitals  Enc Vitals Group     BP 11/09/21 1203 125/81     Pulse Rate 11/09/21 1203 64     Resp 11/09/21 1203 18     Temp 11/09/21 1203 97.9 F (36.6 C)     Temp Source 11/09/21 1203 Oral     SpO2 11/09/21 1203 98 %     Weight --      Height --      Head Circumference --      Peak Flow --      Pain Score 11/09/21 1206 6     Pain Loc --      Pain Edu? --      Excl. in GC? --    No data found.  Updated Vital Signs BP 125/81 (BP Location: Left Arm)   Pulse 64   Temp 97.9 F (36.6 C) (Oral)   Resp 18   SpO2 98%   Visual Acuity Right Eye Distance:   Left Eye Distance:   Bilateral Distance:    Right  Eye Near:   Left Eye Near:    Bilateral Near:     Physical Exam Vitals and nursing note reviewed.  Constitutional:      General: He is not in acute distress.    Appearance: He is well-developed.  HENT:     Head: Normocephalic and atraumatic.  Eyes:     Conjunctiva/sclera: Conjunctivae normal.  Cardiovascular:     Rate and Rhythm: Normal rate and regular rhythm.     Heart sounds: No murmur heard. Pulmonary:     Effort: Pulmonary effort is normal. No respiratory distress.     Breath sounds: Normal breath sounds.  Abdominal:     Palpations: Abdomen is soft.     Tenderness: There is no abdominal tenderness.  Musculoskeletal:        General: No swelling.     Right hand: Laceration present.     Cervical back: Neck supple.     Comments: Laceration to the right middle finger below the nail bed, laceration to right ring finger to tip of finger. Pain with palpation of right middle finger distal phlanx. Normal pules.   Skin is macerated, wet under the dressing.   Skin:    General: Skin is warm and dry.     Capillary Refill: Capillary refill takes less than 2 seconds.  Neurological:     Mental Status: He is alert.  Psychiatric:        Mood and Affect: Mood normal.      UC Treatments / Results  Labs (all labs ordered are listed, but only abnormal results are displayed) Labs Reviewed - No data to display  EKG   Radiology DG Hand Complete Right  Result Date: 11/09/2021 CLINICAL DATA:  Crush injury to right middle and ring finger.  Pain. EXAM: RIGHT HAND - COMPLETE 3+ VIEW COMPARISON:  None Available. FINDINGS: There is a comminuted fracture through the distal half of the third distal phalanx. No other fractures identified. Degenerative changes between the base of the first metacarpal and trapezium. No other abnormalities. IMPRESSION: 1. Comminuted fracture through the distal half of the distal third phalanx. No other acute abnormalities. Electronically Signed   By: Gerome Sam III M.D.   On: 11/09/2021 12:43    Procedures Procedures (including critical care time)  Medications Ordered in UC Medications - No data to display  Initial Impression / Assessment and Plan / UC Course  I have reviewed the triage vital signs and the nursing notes.  Pertinent labs & imaging results that were available during my care of the patient were reviewed by me and considered in my medical decision making (see chart for details).     Comminuted fracture distal third phalanx with laceration.  Discussed with ortho hand, Dr. Eulah Pont.  Antibiotic started.  Splint applied with materials available in clinic.  Discussed follow up with Dr. Eulah Pont next week.   Pt became light headed and nauseated while cleaning wound, likely vasovagal.  Given Zofran and water.  Pt improved, vitals normal.  Stable for discharge.  Final Clinical Impressions(s) / UC Diagnoses   Final diagnoses:  Open nondisplaced fracture of distal phalanx  of right middle finger, initial encounter     Discharge Instructions      Keep wound dry and covered Call orthopedics in the morning Keep elevated. Take Ibuprofen or tylenol as needed for pain    ED Prescriptions     Medication Sig Dispense Auth. Provider   sulfamethoxazole-trimethoprim (BACTRIM DS) 800-160 MG tablet Take 1 tablet by  mouth 2 (two) times daily for 7 days. 14 tablet Ward, Tylene Fantasia, PA-C      PDMP not reviewed this encounter.   Ward, Tylene Fantasia, PA-C 11/09/21 1310

## 2022-02-17 DIAGNOSIS — I471 Supraventricular tachycardia, unspecified: Secondary | ICD-10-CM | POA: Diagnosis not present

## 2022-02-17 DIAGNOSIS — E7800 Pure hypercholesterolemia, unspecified: Secondary | ICD-10-CM | POA: Diagnosis not present

## 2022-02-17 DIAGNOSIS — E1169 Type 2 diabetes mellitus with other specified complication: Secondary | ICD-10-CM | POA: Diagnosis not present

## 2022-05-27 DIAGNOSIS — E1169 Type 2 diabetes mellitus with other specified complication: Secondary | ICD-10-CM | POA: Diagnosis not present

## 2022-05-27 DIAGNOSIS — E7800 Pure hypercholesterolemia, unspecified: Secondary | ICD-10-CM | POA: Diagnosis not present

## 2022-05-27 DIAGNOSIS — E6609 Other obesity due to excess calories: Secondary | ICD-10-CM | POA: Diagnosis not present

## 2022-08-18 DIAGNOSIS — E1169 Type 2 diabetes mellitus with other specified complication: Secondary | ICD-10-CM | POA: Diagnosis not present

## 2022-08-18 DIAGNOSIS — E7800 Pure hypercholesterolemia, unspecified: Secondary | ICD-10-CM | POA: Diagnosis not present

## 2022-10-18 ENCOUNTER — Emergency Department (HOSPITAL_COMMUNITY)
Admission: EM | Admit: 2022-10-18 | Discharge: 2022-10-18 | Disposition: A | Discharge status: Home / Self Care | Payer: BC Managed Care – PPO | Attending: Emergency Medicine | Admitting: Emergency Medicine

## 2022-10-18 ENCOUNTER — Emergency Department (HOSPITAL_COMMUNITY): Payer: BC Managed Care – PPO

## 2022-10-18 ENCOUNTER — Other Ambulatory Visit: Payer: Self-pay

## 2022-10-18 ENCOUNTER — Encounter (HOSPITAL_COMMUNITY): Payer: Self-pay | Admitting: Emergency Medicine

## 2022-10-18 DIAGNOSIS — R1031 Right lower quadrant pain: Secondary | ICD-10-CM | POA: Diagnosis not present

## 2022-10-18 DIAGNOSIS — N200 Calculus of kidney: Secondary | ICD-10-CM | POA: Insufficient documentation

## 2022-10-18 LAB — CBC
HCT: 44.4 % (ref 39.0–52.0)
Hemoglobin: 15.5 g/dL (ref 13.0–17.0)
MCH: 30 pg (ref 26.0–34.0)
MCHC: 34.9 g/dL (ref 30.0–36.0)
MCV: 86 fL (ref 80.0–100.0)
Platelets: 292 10*3/uL (ref 150–400)
RBC: 5.16 MIL/uL (ref 4.22–5.81)
RDW: 11.5 % (ref 11.5–15.5)
WBC: 12.2 10*3/uL — ABNORMAL HIGH (ref 4.0–10.5)
nRBC: 0 % (ref 0.0–0.2)

## 2022-10-18 LAB — URINALYSIS, ROUTINE W REFLEX MICROSCOPIC
Bilirubin Urine: NEGATIVE
Glucose, UA: 150 mg/dL — AB
Ketones, ur: 20 mg/dL — AB
Leukocytes,Ua: NEGATIVE
Nitrite: NEGATIVE
Protein, ur: 30 mg/dL — AB
Specific Gravity, Urine: 1.033 — ABNORMAL HIGH (ref 1.005–1.030)
pH: 5 (ref 5.0–8.0)

## 2022-10-18 LAB — BASIC METABOLIC PANEL
Anion gap: 11 (ref 5–15)
BUN: 17 mg/dL (ref 6–20)
CO2: 24 mmol/L (ref 22–32)
Calcium: 9.2 mg/dL (ref 8.9–10.3)
Chloride: 100 mmol/L (ref 98–111)
Creatinine, Ser: 1.57 mg/dL — ABNORMAL HIGH (ref 0.61–1.24)
GFR, Estimated: 51 mL/min — ABNORMAL LOW (ref >60)
Glucose, Bld: 253 mg/dL — ABNORMAL HIGH (ref 70–99)
Potassium: 3.9 mmol/L (ref 3.5–5.1)
Sodium: 135 mmol/L (ref 135–145)

## 2022-10-18 MED ORDER — KETOROLAC TROMETHAMINE 30 MG/ML IJ SOLN
30.0000 mg | Freq: Once | INTRAMUSCULAR | Status: AC
  Administered 2022-10-18: 30 mg via INTRAVENOUS
  Filled 2022-10-18: qty 1

## 2022-10-18 MED ORDER — HYDROMORPHONE HCL 1 MG/ML IJ SOLN
0.5000 mg | Freq: Once | INTRAMUSCULAR | Status: AC
  Administered 2022-10-18: 0.5 mg via INTRAVENOUS
  Filled 2022-10-18: qty 1

## 2022-10-18 NOTE — ED Triage Notes (Signed)
Pt in with R flank pain that radiates around to RLQ, onset 2 hrs ago. Pt does report episode of emesis PTA. Has hx of kidney stones

## 2022-10-18 NOTE — ED Provider Notes (Signed)
[place] EMERGENCY DEPARTMENT AT [facility] Provider Note  CSN: [id number] Arrival date & time: 10/18/22 0203  Chief Complaint(s) Flank Pain  HPI [name] Barrera is a 57 y.o. male here for right-sided flank pain that began approximately 3 hours prior to arrival.  Pain was gradual onset but rapidly progressing radiating to the right lower quadrant.  Intensity was fluctuating in nature.  He had associated nausea with nonbloody nonbilious emesis.  No recent fevers or infections.  No cough or congestion.  No urinary symptoms.  Patient still having bowel movements   Flank Pain    Past Medical History History reviewed. No pertinent past medical history. There are no problems to display for this patient.  Home Medication(s) Prior to Admission medications   Medication Sig Start Date End Date Taking? Authorizing Provider  ondansetron (ZOFRAN) 4 MG tablet Take 1 tablet (4 mg total) by mouth every 6 (six) hours. 12/13/12   Audelia Hives, MD  oxyCODONE-acetaminophen (PERCOCET) 5-325 MG per tablet Take 1 tablet by mouth every 4 (four) hours as needed for pain. 12/13/12   Audelia Hives, MD  tamsulosin (FLOMAX) 0.4 MG CAPS Take 1 capsule (0.4 mg total) by mouth daily. 12/13/12   Audelia Hives, MD                                                                                                                                    Allergies Patient has no known allergies.  Review of Systems Review of Systems  Genitourinary:  Positive for flank pain.   As noted in HPI  Physical Exam Vital Signs  I have reviewed the triage vital signs BP 114/78   Pulse 79   Temp 97.7 F (36.5 C) (Oral)   Resp (!) 22   Wt 111.1 kg   SpO2 100%   BMI 32.31 kg/m   Physical Exam Vitals reviewed.  Constitutional:      General: He is not in acute distress.    Appearance: He is well-developed. He is not diaphoretic.  HENT:     Head: Normocephalic and atraumatic.     Right Ear: External ear  normal.     Left Ear: External ear normal.     Nose: Nose normal.     Mouth/Throat:     Mouth: Mucous membranes are moist.  Eyes:     General: No scleral icterus.    Conjunctiva/sclera: Conjunctivae normal.  Neck:     Trachea: Phonation normal.  Cardiovascular:     Rate and Rhythm: Normal rate and regular rhythm.  Pulmonary:     Effort: Pulmonary effort is normal. No respiratory distress.     Breath sounds: No stridor.  Abdominal:     General: There is no distension.     Tenderness: There is no abdominal tenderness.  Musculoskeletal:        General: Normal range of motion.     Cervical back: Normal range  of motion.  Neurological:     Mental Status: He is alert and oriented to person, place, and time.  Psychiatric:        Behavior: Behavior normal.     ED Results and Treatments Labs (all labs ordered are listed, but only abnormal results are displayed) Labs Reviewed  URINALYSIS, ROUTINE W REFLEX MICROSCOPIC - Abnormal; Notable for the following components:      Result Value   Specific Gravity, Urine 1.033 (*)    Glucose, UA 150 (*)    Hgb urine dipstick MODERATE (*)    Ketones, ur 20 (*)    Protein, ur 30 (*)    Bacteria, UA RARE (*)    All other components within normal limits  BASIC METABOLIC PANEL - Abnormal; Notable for the following components:   Glucose, Bld 253 (*)    Creatinine, Ser 1.57 (*)    GFR, Estimated 51 (*)    All other components within normal limits  CBC - Abnormal; Notable for the following components:   WBC 12.2 (*)    All other components within normal limits                                                                                                                         EKG  EKG Interpretation  Date/Time:    Ventricular Rate:    PR Interval:    QRS Duration:   QT Interval:    QTC Calculation:   R Axis:     Text Interpretation:         Radiology CT Renal Stone Study  Result Date: 10/18/2022 CLINICAL DATA:  57 year old male  with history of abdominal and flank pain. Suspected stone. EXAM: CT ABDOMEN AND PELVIS WITHOUT CONTRAST TECHNIQUE: Multidetector CT imaging of the abdomen and pelvis was performed following the standard protocol without IV contrast. RADIATION DOSE REDUCTION: This exam was performed according to the departmental dose-optimization program which includes automated exposure control, adjustment of the mA and/or kV according to patient size and/or use of iterative reconstruction technique. COMPARISON:  CT of the abdomen and pelvis 12/12/2012. FINDINGS: Lower chest: Unremarkable. Hepatobiliary: Diffuse low attenuation throughout the visualized hepatic parenchyma, indicative of a background of hepatic steatosis. Unenhanced appearance of the gallbladder is unremarkable. Pancreas: No definite pancreatic mass or peripancreatic fluid collections or inflammatory changes are noted on today's noncontrast CT examination. Spleen: Unremarkable. Adrenals/Urinary Tract: Multiple nonobstructive calculi are noted within the collecting systems of both kidneys measuring up to 5 mm in the interpolar collecting system of the left kidney. In addition, in the right-side of the urinary bladder (axial image 87 of series 3) there is a 3 mm calculus lying dependently. This appears likely separated from the right ureterovesicular junction at this time. Minimal fullness of the right ureter and right renal collecting system, without frank hydroureteronephrosis, along with very mild right-sided periureteric and perinephric soft tissue stranding, which likely suggests recent (now resolved) right-sided obstruction. Several small low-attenuation lesions are noted in  the kidneys bilaterally, incompletely characterized on today's noncontrast CT examination, but statistically likely to represent cysts (no imaging follow-up recommended), largest of which measures 2.3 cm in the lower pole of the left kidney. Bilateral adrenal glands are normal in appearance.  Stomach/Bowel: Unenhanced appearance of the stomach is normal. No pathologic dilatation of small bowel or colon. Normal appendix. Vascular/Lymphatic: Atherosclerotic calcifications are noted in the abdominal aorta and pelvic vasculature. No lymphadenopathy noted in the abdomen or pelvis. Reproductive: Prostate gland and seminal vesicles are unremarkable in appearance. Other: No significant volume of ascites.  No pneumoperitoneum. Musculoskeletal: There are no aggressive appearing lytic or blastic lesions noted in the visualized portions of the skeleton. IMPRESSION: 1. 3 mm calculus in the urinary bladder, with evidence of recently relieved right-sided obstruction, suggesting passage of the stone from the right side. No ureteral stones or frank hydroureteronephrosis noted at this time. 2. Multiple additional nonobstructive calculi are noted within the collecting systems of both kidneys, measuring up to 5 mm in the interpolar collecting system of left kidney. 3. Hepatic steatosis. 4. Aortic atherosclerosis. Electronically Signed   By: Trudie Reed M.D.   On: 10/18/2022 06:10    Medications Ordered in ED Medications  ketorolac (TORADOL) 30 MG/ML injection 30 mg (30 mg Intravenous Given 10/18/22 0243)  HYDROmorphone (DILAUDID) injection 0.5 mg (0.5 mg Intravenous Given 10/18/22 0244)   Procedures Procedures  (including critical care time) Medical Decision Making / ED Course   Medical Decision Making Amount and/or Complexity of Data Reviewed Labs: ordered. Decision-making details documented in ED Course. Radiology: ordered and independent interpretation performed. Decision-making details documented in ED Course.    Right flank pain.  Differential includes renal colic, ascending urinary tract infection, biliary disease, SBO, intra-abdominal inflammatory/infectious process.  Patient given IV Dilaudid and Toradol in triage which completely resolved his pain.  CBC with leukocytosis.  No  anemia. Metabolic panel without significant electrolyte derangements.  Hyperglycemia without evidence of DKA. Mild renal insufficiency without obvious AKI.  Last creatinine check was 9 years ago and similar. UA without evidence of infection.  Patient does have hematuria CT renal study notable for 3 mm stone already in the bladder.  Patient has multiple bilateral nonobstructing stones in the kidneys.  Patient informed of the remaining stones. He remained asymptomatic Expectant management Urology follow-up as needed.    Final Clinical Impression(s) / ED Diagnoses Final diagnoses:  Kidney stone   The patient appears reasonably screened and/or stabilized for discharge and I doubt any other medical condition or other [facility] requiring further screening, evaluation, or treatment in the ED at this time. I have discussed the findings, Dx and Tx plan with the patient/family who expressed understanding and agree(s) with the plan. Discharge instructions discussed at length. The patient/family was given strict return precautions who verbalized understanding of the instructions. No further questions at time of discharge.  Disposition: Discharge  Condition: Good  ED Discharge Orders     None        Follow Up: [facility] [address] [place] [address] [phone] Call  as needed  Primary care provider  Schedule an appointment as soon as possible for a visit      This chart was dictated using voice recognition software.  Despite best efforts to proofread,  errors can occur which can change the documentation meaning.    Nira Conn, MD 10/18/22 [phone]

## 2023-01-11 DIAGNOSIS — E7800 Pure hypercholesterolemia, unspecified: Secondary | ICD-10-CM | POA: Diagnosis not present

## 2023-01-11 DIAGNOSIS — E119 Type 2 diabetes mellitus without complications: Secondary | ICD-10-CM | POA: Diagnosis not present

## 2023-07-14 DIAGNOSIS — I471 Supraventricular tachycardia, unspecified: Secondary | ICD-10-CM | POA: Diagnosis not present

## 2023-07-14 DIAGNOSIS — E119 Type 2 diabetes mellitus without complications: Secondary | ICD-10-CM | POA: Diagnosis not present

## 2023-07-14 DIAGNOSIS — E7800 Pure hypercholesterolemia, unspecified: Secondary | ICD-10-CM | POA: Diagnosis not present

## 2023-07-14 DIAGNOSIS — E1165 Type 2 diabetes mellitus with hyperglycemia: Secondary | ICD-10-CM | POA: Diagnosis not present

## 2023-07-14 DIAGNOSIS — N529 Male erectile dysfunction, unspecified: Secondary | ICD-10-CM | POA: Diagnosis not present

## 2023-09-02 DIAGNOSIS — R319 Hematuria, unspecified: Secondary | ICD-10-CM | POA: Diagnosis not present

## 2024-01-04 DIAGNOSIS — I471 Supraventricular tachycardia, unspecified: Secondary | ICD-10-CM | POA: Diagnosis not present

## 2024-01-04 DIAGNOSIS — Z125 Encounter for screening for malignant neoplasm of prostate: Secondary | ICD-10-CM | POA: Diagnosis not present

## 2024-01-04 DIAGNOSIS — Z0000 Encounter for general adult medical examination without abnormal findings: Secondary | ICD-10-CM | POA: Diagnosis not present

## 2024-01-04 DIAGNOSIS — E7800 Pure hypercholesterolemia, unspecified: Secondary | ICD-10-CM | POA: Diagnosis not present

## 2024-01-04 DIAGNOSIS — N529 Male erectile dysfunction, unspecified: Secondary | ICD-10-CM | POA: Diagnosis not present

## 2024-01-04 DIAGNOSIS — E119 Type 2 diabetes mellitus without complications: Secondary | ICD-10-CM | POA: Diagnosis not present

## 2024-06-02 ENCOUNTER — Ambulatory Visit: Admission: EM | Admit: 2024-06-02 | Discharge: 2024-06-02 | Disposition: A | Discharge status: Home / Self Care

## 2024-06-02 ENCOUNTER — Ambulatory Visit (INDEPENDENT_AMBULATORY_CARE_PROVIDER_SITE_OTHER)

## 2024-06-02 ENCOUNTER — Encounter: Payer: Self-pay | Admitting: *Deleted

## 2024-06-02 DIAGNOSIS — S4991XA Unspecified injury of right shoulder and upper arm, initial encounter: Secondary | ICD-10-CM

## 2024-06-02 DIAGNOSIS — M7581 Other shoulder lesions, right shoulder: Secondary | ICD-10-CM | POA: Diagnosis not present

## 2024-06-02 HISTORY — DX: Type 2 diabetes mellitus without complications: E11.9

## 2024-06-02 HISTORY — DX: Calculus of kidney: N20.0

## 2024-06-02 HISTORY — DX: Essential (primary) hypertension: I10

## 2024-06-02 MED ORDER — HYDROCODONE-ACETAMINOPHEN 5-325 MG PO TABS: 1.0000 | ORAL_TABLET | ORAL | 0 refills | Status: AC | PRN

## 2024-06-02 MED ORDER — METHOCARBAMOL 500 MG PO TABS: 500.0000 mg | ORAL_TABLET | Freq: Four times a day (QID) | ORAL | 0 refills | Status: AC

## 2024-06-02 MED ORDER — DICLOFENAC SODIUM 75 MG PO TBEC: 75.0000 mg | DELAYED_RELEASE_TABLET | Freq: Two times a day (BID) | ORAL | 0 refills | Status: AC

## 2024-06-02 NOTE — ED Provider Notes (Signed)
 " [facility]    CSN: [id number] Arrival date & time: 06/02/24  0810      History   Chief Complaint Chief Complaint  Patient presents with   Shoulder Pain    HPI [name] Barrera is a 59 y.o. male.   Pt complains of pain in his right shoulder after lifting up a door at work.  Pt complains of pain in the shoulder that goes down his arm.   The history is provided by the patient. No language interpreter was used.  Shoulder Pain Location:  Shoulder Shoulder location:  R shoulder Injury: yes   Pain details:    Quality:  Aching   Radiates to:  R shoulder   Severity:  Moderate   Onset quality:  Sudden   Timing:  Constant   Progression:  Worsening   Past Medical History:  Diagnosis Date   Diabetes mellitus without complication (HCC)    Hypertension    Kidney stone     There are no active problems to display for this patient.   History reviewed. No pertinent surgical history.     Home Medications    Prior to Admission medications  Medication Sig Start Date End Date Taking? Authorizing Provider  atorvastatin (LIPITOR) 20 MG tablet Take 20 mg by mouth daily. 03/16/24  Yes [provider]  diclofenac  (VOLTAREN ) 75 MG EC tablet Take 1 tablet (75 mg total) by mouth 2 (two) times daily. 06/02/24  Yes Estalene Bergey K, PA-C  HYDROcodone -acetaminophen  (NORCO/VICODIN) 5-325 MG tablet Take 1 tablet by mouth every 4 (four) hours as needed for moderate pain (pain score 4-6). 06/02/24  Yes Abrielle Finck K, PA-C  JARDIANCE 10 MG TABS tablet Take 10 mg by mouth daily. 03/06/24  Yes [provider]  metFORMIN (GLUCOPHAGE-XR) 500 MG 24 hr tablet Take 1,000 mg by mouth 2 (two) times daily. 03/16/24  Yes [provider]  methocarbamol  (ROBAXIN ) 500 MG tablet Take 1 tablet (500 mg total) by mouth 4 (four) times daily. 06/02/24  Yes Mina Carlisi K, PA-C  metoprolol tartrate (LOPRESSOR) 50 MG tablet Take 50 mg by mouth daily. 05/03/24  Yes [provider]  sildenafil (VIAGRA) 100 MG tablet Take 100 mg by mouth as needed. 05/16/24  Yes [provider]  ondansetron  (ZOFRAN ) 4 MG tablet Take 1 tablet (4 mg total) by mouth every 6 (six) hours. Patient not taking: Reported on 06/02/2024 12/13/12   Matias Dunnings, MD  oxyCODONE -acetaminophen  (PERCOCET) 5-325 MG per tablet Take 1 tablet by mouth every 4 (four) hours as needed for pain. Patient not taking: Reported on 06/02/2024 12/13/12   Matias Dunnings, MD  tamsulosin  (FLOMAX ) 0.4 MG CAPS Take 1 capsule (0.4 mg total) by mouth daily. Patient not taking: Reported on 06/02/2024 12/13/12   Matias Dunnings, MD    Family History Family History  Family history unknown: Yes    Social History Social History[1]   Allergies   Patient has no known allergies.   Review of Systems Review of Systems  All other systems reviewed and are negative.    Physical Exam Triage Vital Signs ED Triage Vitals  Encounter Vitals Group     BP 06/02/24 0832 (!) 148/90     Girls Systolic BP Percentile --      Girls Diastolic BP Percentile --      Boys Systolic BP Percentile --      Boys Diastolic BP Percentile --      Pulse Rate 06/02/24 0832 62  Resp 06/02/24 0832 18     Temp 06/02/24 0832 98 F (36.7 C)     Temp Source 06/02/24 0832 Oral     SpO2 06/02/24 0832 98 %     Weight --      Height --      Head Circumference --      Peak Flow --      Pain Score 06/02/24 0829 9     Pain Loc --      Pain Education --      Exclude from Growth Chart --    No data found.  Updated Vital Signs BP (!) 148/90 (BP Location: Left Arm)   Pulse 62   Temp 98 F (36.7 C) (Oral)   Resp 18   SpO2 98%   Visual Acuity Right Eye Distance:   Left Eye Distance:   Bilateral Distance:    Right Eye Near:   Left Eye Near:    Bilateral Near:     Physical Exam Vitals reviewed.  Constitutional:      Appearance: Normal appearance.  Cardiovascular:     Rate and Rhythm: Normal rate.   Pulmonary:     Effort: Pulmonary effort is normal.  Musculoskeletal:        General: Swelling and tenderness present.     Cervical back: Normal range of motion.     Comments: Tender right shoulder,  decreased abduction, pain with movement.   Skin:    General: Skin is warm.  Neurological:     General: No focal deficit present.     Mental Status: He is alert.      UC Treatments / Results  Labs (all labs ordered are listed, but only abnormal results are displayed) Labs Reviewed - No data to display  EKG   Radiology DG Shoulder Right Result Date: 06/02/2024 EXAM: 1 VIEW(S) XRAY OF THE SHOULDER 06/02/2024 09:00:19 AM COMPARISON: None available. CLINICAL HISTORY: pain pain pain pain pain FINDINGS: BONES AND JOINTS: Mild joint space narrowing of the glenohumeral joint. Glenohumeral joint is normally aligned. No acute fracture. No malalignment. Moderate joint space narrowing and osteophyte formation of the acromioclavicular joint. With the arm in neutral position, the acromiohumeral interval is 8 mm. With the arm abducted, the acromiohumeral interval is 6 mm. SOFT TISSUES: No abnormal calcifications. Visualized lung is unremarkable. IMPRESSION: 1. Dynamic acromiohumeral interval narrowing, suggestive of rotator cuff pathology (including full-thickness tear) and/or subacromial impingement; consider MRI (or ultrasound) for further evaluation if symptoms persist or there is weakness. 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 Electronically signed by: Waddell Calk MD 06/02/2024 09:21 AM EST RP Workstation: [id number]    Procedures Procedures (including critical care time)  Medications Ordered in UC Medications - No data to display  Initial Impression / Assessment and Plan / UC Course  I have reviewed the triage vital signs and the nursing notes.  Pertinent labs & imaging results that were available during my care of the patient were reviewed by me  and considered in my medical decision making (see chart for details).     Pt counseled on concern for rotator cuff injury vs tendonitis.  Pt advised to schedule to see the Orthopaedist for evaltuion.  Final Clinical Impressions(s) / UC Diagnoses   Final diagnoses:  Injury of right shoulder, initial encounter  Rotator cuff tendonitis, right     Discharge Instructions      Return if any problems. Schedule to see the Orthopaedist for evaluation    ED Prescriptions  Medication Sig Dispense Auth. Provider   methocarbamol  (ROBAXIN ) 500 MG tablet Take 1 tablet (500 mg total) by mouth 4 (four) times daily. 20 tablet Ovetta Bazzano K, PA-C   HYDROcodone -acetaminophen  (NORCO/VICODIN) 5-325 MG tablet Take 1 tablet by mouth every 4 (four) hours as needed for moderate pain (pain score 4-6). 16 tablet Onell Mcmath K, PA-C   diclofenac  (VOLTAREN ) 75 MG EC tablet Take 1 tablet (75 mg total) by mouth 2 (two) times daily. 20 tablet Keiva Dina K, PA-C      I have reviewed the PDMP during this encounter.    [1]  Social History Tobacco Use   Smoking status: Never   Smokeless tobacco: Current    Types: Snuff  Vaping Use   Vaping status: Never Used  Substance Use Topics   Alcohol use: Not Currently   Drug use: Never     Fl[place] Sonny POUR, PA-C 06/02/24 1020  "

## 2024-06-02 NOTE — ED Triage Notes (Signed)
 Right shoulder pain x 1 week. Pain radiates into the arm. Denies injury/fall. Taking ibuprofen without relief

## 2024-06-02 NOTE — Discharge Instructions (Addendum)
Return if any problems.  Schedule to see the Orthopaedist for evaluation °
